# Patient Record
Sex: Male | Born: 1965 | Race: White | Hispanic: No | Marital: Married | Smoking: Former smoker
Health system: Southern US, Community
[De-identification: ages and names within clinical notes are randomized; demographics above are authoritative.]

## PROBLEM LIST (undated history)

## (undated) HISTORY — PX: WRIST SURGERY: SHX841

---

## 2016-06-21 ENCOUNTER — Emergency Department (HOSPITAL_COMMUNITY): Payer: Worker's Compensation

## 2016-06-21 ENCOUNTER — Emergency Department (HOSPITAL_COMMUNITY): Payer: Worker's Compensation | Admitting: Certified Registered Nurse Anesthetist

## 2016-06-21 ENCOUNTER — Encounter (HOSPITAL_COMMUNITY): Admission: EM | Disposition: A | Discharge status: Home Health | Payer: Self-pay | Source: Home / Self Care

## 2016-06-21 ENCOUNTER — Inpatient Hospital Stay (HOSPITAL_COMMUNITY)
Admission: EM | Admit: 2016-06-21 | Discharge: 2016-06-26 | DRG: 473 | Disposition: A | Discharge status: Home Health | Payer: Worker's Compensation | Attending: Emergency Medicine | Admitting: Emergency Medicine

## 2016-06-21 ENCOUNTER — Encounter (HOSPITAL_COMMUNITY): Payer: Self-pay | Admitting: *Deleted

## 2016-06-21 DIAGNOSIS — R42 Dizziness and giddiness: Secondary | ICD-10-CM | POA: Diagnosis not present

## 2016-06-21 DIAGNOSIS — M542 Cervicalgia: Secondary | ICD-10-CM | POA: Diagnosis present

## 2016-06-21 DIAGNOSIS — J029 Acute pharyngitis, unspecified: Secondary | ICD-10-CM | POA: Diagnosis not present

## 2016-06-21 DIAGNOSIS — S0101XA Laceration without foreign body of scalp, initial encounter: Secondary | ICD-10-CM | POA: Diagnosis present

## 2016-06-21 DIAGNOSIS — M2578 Osteophyte, vertebrae: Secondary | ICD-10-CM | POA: Diagnosis present

## 2016-06-21 DIAGNOSIS — S12500A Unspecified displaced fracture of sixth cervical vertebra, initial encounter for closed fracture: Secondary | ICD-10-CM | POA: Diagnosis present

## 2016-06-21 DIAGNOSIS — S129XXA Fracture of neck, unspecified, initial encounter: Secondary | ICD-10-CM

## 2016-06-21 DIAGNOSIS — M50223 Other cervical disc displacement at C6-C7 level: Secondary | ICD-10-CM | POA: Diagnosis present

## 2016-06-21 DIAGNOSIS — Z981 Arthrodesis status: Secondary | ICD-10-CM

## 2016-06-21 DIAGNOSIS — S12600A Unspecified displaced fracture of seventh cervical vertebra, initial encounter for closed fracture: Secondary | ICD-10-CM | POA: Diagnosis present

## 2016-06-21 DIAGNOSIS — Y92410 Unspecified street and highway as the place of occurrence of the external cause: Secondary | ICD-10-CM | POA: Diagnosis not present

## 2016-06-21 DIAGNOSIS — R40241 Glasgow coma scale score 13-15, unspecified time: Secondary | ICD-10-CM | POA: Diagnosis not present

## 2016-06-21 DIAGNOSIS — Z87891 Personal history of nicotine dependence: Secondary | ICD-10-CM

## 2016-06-21 DIAGNOSIS — Z419 Encounter for procedure for purposes other than remedying health state, unspecified: Secondary | ICD-10-CM

## 2016-06-21 HISTORY — PX: ANTERIOR CERVICAL DECOMP/DISCECTOMY FUSION: SHX1161

## 2016-06-21 HISTORY — PX: SCALP LACERATION REPAIR: SHX6089

## 2016-06-21 LAB — I-STAT CHEM 8, ED
BUN: 23 mg/dL — AB (ref 6–20)
CHLORIDE: 104 mmol/L (ref 101–111)
Calcium, Ion: 1.12 mmol/L — ABNORMAL LOW (ref 1.15–1.40)
Creatinine, Ser: 1 mg/dL (ref 0.61–1.24)
GLUCOSE: 120 mg/dL — AB (ref 65–99)
HCT: 43 % (ref 39.0–52.0)
Hemoglobin: 14.6 g/dL (ref 13.0–17.0)
POTASSIUM: 3.8 mmol/L (ref 3.5–5.1)
SODIUM: 139 mmol/L (ref 135–145)
TCO2: 23 mmol/L (ref 0–100)

## 2016-06-21 LAB — COMPREHENSIVE METABOLIC PANEL
ALBUMIN: 3.9 g/dL (ref 3.5–5.0)
ALK PHOS: 43 U/L (ref 38–126)
ALT: 33 U/L (ref 17–63)
ANION GAP: 6 (ref 5–15)
AST: 31 U/L (ref 15–41)
BILIRUBIN TOTAL: 0.7 mg/dL (ref 0.3–1.2)
BUN: 19 mg/dL (ref 6–20)
CO2: 24 mmol/L (ref 22–32)
CREATININE: 1.09 mg/dL (ref 0.61–1.24)
Calcium: 8.7 mg/dL — ABNORMAL LOW (ref 8.9–10.3)
Chloride: 107 mmol/L (ref 101–111)
GFR calc Af Amer: >60 mL/min (ref >60)
GFR calc non Af Amer: >60 mL/min (ref >60)
GLUCOSE: 121 mg/dL — AB (ref 65–99)
Potassium: 3.9 mmol/L (ref 3.5–5.1)
SODIUM: 137 mmol/L (ref 135–145)
TOTAL PROTEIN: 6.2 g/dL — AB (ref 6.5–8.1)

## 2016-06-21 LAB — CBC
HCT: 43.5 % (ref 39.0–52.0)
Hemoglobin: 14.9 g/dL (ref 13.0–17.0)
MCH: 30.6 pg (ref 26.0–34.0)
MCHC: 34.3 g/dL (ref 30.0–36.0)
MCV: 89.3 fL (ref 78.0–100.0)
PLATELETS: 217 10*3/uL (ref 150–400)
RBC: 4.87 MIL/uL (ref 4.22–5.81)
RDW: 12.6 % (ref 11.5–15.5)
WBC: 12 10*3/uL — ABNORMAL HIGH (ref 4.0–10.5)

## 2016-06-21 LAB — I-STAT CG4 LACTIC ACID, ED: Lactic Acid, Venous: 1.55 mmol/L (ref 0.5–1.9)

## 2016-06-21 LAB — MRSA PCR SCREENING: MRSA BY PCR: NEGATIVE

## 2016-06-21 LAB — PROTIME-INR
INR: 0.99
Prothrombin Time: 13.1 seconds (ref 11.4–15.2)

## 2016-06-21 LAB — SAMPLE TO BLOOD BANK

## 2016-06-21 LAB — ETHANOL

## 2016-06-21 SURGERY — ANTERIOR CERVICAL DECOMPRESSION/DISCECTOMY FUSION 1 LEVEL
Anesthesia: General

## 2016-06-21 MED ORDER — HYDROMORPHONE HCL 1 MG/ML IJ SOLN: 0.5000 mg | INTRAMUSCULAR | Status: DC | PRN

## 2016-06-21 MED ORDER — PHENYLEPHRINE HCL 10 MG/ML IJ SOLN
INTRAVENOUS | Status: DC | PRN
  Administered 2016-06-21: 15 ug/min via INTRAVENOUS

## 2016-06-21 MED ORDER — OXYCODONE HCL 5 MG PO TABS: 5.0000 mg | ORAL_TABLET | Freq: Once | ORAL | Status: DC | PRN

## 2016-06-21 MED ORDER — THROMBIN 5000 UNITS EX SOLR
CUTANEOUS | Status: AC
  Filled 2016-06-21: qty 5000

## 2016-06-21 MED ORDER — ONDANSETRON HCL 4 MG/2ML IJ SOLN
INTRAMUSCULAR | Status: AC
  Filled 2016-06-21: qty 2

## 2016-06-21 MED ORDER — MIDAZOLAM HCL 2 MG/2ML IJ SOLN
INTRAMUSCULAR | Status: AC
  Filled 2016-06-21: qty 2

## 2016-06-21 MED ORDER — MIDAZOLAM HCL 2 MG/2ML IJ SOLN
INTRAMUSCULAR | Status: DC | PRN
  Administered 2016-06-21: 2 mg via INTRAVENOUS

## 2016-06-21 MED ORDER — PROPOFOL 10 MG/ML IV BOLUS
INTRAVENOUS | Status: AC
  Filled 2016-06-21: qty 20

## 2016-06-21 MED ORDER — SUGAMMADEX SODIUM 200 MG/2ML IV SOLN
INTRAVENOUS | Status: DC | PRN
  Administered 2016-06-21: 145 mg via INTRAVENOUS

## 2016-06-21 MED ORDER — DEXAMETHASONE SODIUM PHOSPHATE 10 MG/ML IJ SOLN
INTRAMUSCULAR | Status: DC | PRN
  Administered 2016-06-21: 10 mg via INTRAVENOUS

## 2016-06-21 MED ORDER — BACITRACIN 50000 UNITS IM SOLR
INTRAMUSCULAR | Status: DC | PRN
  Administered 2016-06-21: 500 mL

## 2016-06-21 MED ORDER — ROCURONIUM BROMIDE 100 MG/10ML IV SOLN
INTRAVENOUS | Status: DC | PRN
  Administered 2016-06-21: 10 mg via INTRAVENOUS
  Administered 2016-06-21: 50 mg via INTRAVENOUS

## 2016-06-21 MED ORDER — FENTANYL CITRATE (PF) 100 MCG/2ML IJ SOLN: 25.0000 ug | INTRAMUSCULAR | Status: DC | PRN

## 2016-06-21 MED ORDER — CEFAZOLIN SODIUM-DEXTROSE 2-3 GM-% IV SOLR
INTRAVENOUS | Status: DC | PRN
  Administered 2016-06-21: 2 g via INTRAVENOUS

## 2016-06-21 MED ORDER — PROPOFOL 10 MG/ML IV BOLUS
INTRAVENOUS | Status: DC | PRN
  Administered 2016-06-21: 180 mg via INTRAVENOUS

## 2016-06-21 MED ORDER — HEMOSTATIC AGENTS (NO CHARGE) OPTIME
TOPICAL | Status: DC | PRN
  Administered 2016-06-21: 1 via TOPICAL

## 2016-06-21 MED ORDER — THROMBIN 5000 UNITS EX SOLR
CUTANEOUS | Status: DC | PRN
  Administered 2016-06-21 (×4): 5000 [IU] via TOPICAL

## 2016-06-21 MED ORDER — ONDANSETRON HCL 4 MG/2ML IJ SOLN
4.0000 mg | Freq: Four times a day (QID) | INTRAMUSCULAR | Status: DC | PRN
  Administered 2016-06-21: 4 mg via INTRAVENOUS

## 2016-06-21 MED ORDER — HYDROMORPHONE HCL 1 MG/ML IJ SOLN: 1.0000 mg | INTRAMUSCULAR | Status: DC | PRN

## 2016-06-21 MED ORDER — ACETAMINOPHEN 10 MG/ML IV SOLN
INTRAVENOUS | Status: DC | PRN
  Administered 2016-06-21: 1000 mg via INTRAVENOUS

## 2016-06-21 MED ORDER — LIDOCAINE-EPINEPHRINE 1 %-1:100000 IJ SOLN
INTRAMUSCULAR | Status: DC | PRN
Start: 2016-06-21 — End: 2016-06-21
  Administered 2016-06-21: 9 mL

## 2016-06-21 MED ORDER — BUPIVACAINE HCL (PF) 0.5 % IJ SOLN
INTRAMUSCULAR | Status: DC | PRN
  Administered 2016-06-21: 9 mL

## 2016-06-21 MED ORDER — LACTATED RINGERS IV SOLN
INTRAVENOUS | Status: DC
  Administered 2016-06-21 (×3): via INTRAVENOUS

## 2016-06-21 MED ORDER — LIDOCAINE-EPINEPHRINE 1 %-1:100000 IJ SOLN
INTRAMUSCULAR | Status: AC
  Filled 2016-06-21: qty 1

## 2016-06-21 MED ORDER — HYDROMORPHONE HCL 1 MG/ML IJ SOLN
1.0000 mg | INTRAMUSCULAR | Status: DC | PRN
  Administered 2016-06-21: 1 mg via INTRAVENOUS
  Filled 2016-06-21: qty 1

## 2016-06-21 MED ORDER — IOPAMIDOL (ISOVUE-300) INJECTION 61%
INTRAVENOUS | Status: AC
  Administered 2016-06-21: 100 mL
  Filled 2016-06-21: qty 100

## 2016-06-21 MED ORDER — SODIUM CHLORIDE 0.9 % IV SOLN
INTRAVENOUS | Status: DC
  Administered 2016-06-21 – 2016-06-22 (×2): via INTRAVENOUS

## 2016-06-21 MED ORDER — ONDANSETRON 4 MG PO TBDP
4.0000 mg | ORAL_TABLET | Freq: Four times a day (QID) | ORAL | Status: DC | PRN
  Administered 2016-06-22: 4 mg via ORAL
  Filled 2016-06-21: qty 1

## 2016-06-21 MED ORDER — HYDROCODONE-ACETAMINOPHEN 5-325 MG PO TABS
1.0000 | ORAL_TABLET | ORAL | Status: DC | PRN
  Administered 2016-06-21: 2 via ORAL
  Administered 2016-06-22 – 2016-06-24 (×9): 1 via ORAL
  Filled 2016-06-21 (×2): qty 1
  Filled 2016-06-21 (×2): qty 2
  Filled 2016-06-21 (×6): qty 1

## 2016-06-21 MED ORDER — PANTOPRAZOLE SODIUM 40 MG IV SOLR: 40.0000 mg | Freq: Every day | INTRAVENOUS | Status: DC

## 2016-06-21 MED ORDER — SUGAMMADEX SODIUM 200 MG/2ML IV SOLN
INTRAVENOUS | Status: AC
  Filled 2016-06-21: qty 2

## 2016-06-21 MED ORDER — TETANUS-DIPHTH-ACELL PERTUSSIS 5-2.5-18.5 LF-MCG/0.5 IM SUSP
0.5000 mL | Freq: Once | INTRAMUSCULAR | Status: AC
  Administered 2016-06-21: 0.5 mL via INTRAMUSCULAR
  Filled 2016-06-21: qty 0.5

## 2016-06-21 MED ORDER — THROMBIN 5000 UNITS EX SOLR
CUTANEOUS | Status: AC
  Filled 2016-06-21: qty 15000

## 2016-06-21 MED ORDER — SODIUM CHLORIDE 0.9 % IV BOLUS (SEPSIS)
1000.0000 mL | Freq: Once | INTRAVENOUS | Status: AC
  Administered 2016-06-21: 1000 mL via INTRAVENOUS

## 2016-06-21 MED ORDER — FENTANYL CITRATE (PF) 100 MCG/2ML IJ SOLN
INTRAMUSCULAR | Status: DC | PRN
  Administered 2016-06-21 (×5): 50 ug via INTRAVENOUS

## 2016-06-21 MED ORDER — CEFAZOLIN SODIUM-DEXTROSE 1-4 GM/50ML-% IV SOLN
INTRAVENOUS | Status: AC
  Filled 2016-06-21: qty 50

## 2016-06-21 MED ORDER — LACTATED RINGERS IV SOLN
INTRAVENOUS | Status: DC
Start: 2016-06-21 — End: 2016-06-22

## 2016-06-21 MED ORDER — BUPIVACAINE HCL (PF) 0.5 % IJ SOLN
INTRAMUSCULAR | Status: AC
  Filled 2016-06-21: qty 30

## 2016-06-21 MED ORDER — LIDOCAINE 2% (20 MG/ML) 5 ML SYRINGE
INTRAMUSCULAR | Status: DC | PRN
  Administered 2016-06-21: 60 mg via INTRAVENOUS

## 2016-06-21 MED ORDER — PANTOPRAZOLE SODIUM 40 MG PO TBEC
40.0000 mg | DELAYED_RELEASE_TABLET | Freq: Every day | ORAL | Status: DC
  Administered 2016-06-22: 40 mg via ORAL
  Filled 2016-06-21: qty 1

## 2016-06-21 MED ORDER — DEXAMETHASONE SODIUM PHOSPHATE 10 MG/ML IJ SOLN
INTRAMUSCULAR | Status: AC
  Filled 2016-06-21: qty 1

## 2016-06-21 MED ORDER — LIDOCAINE 2% (20 MG/ML) 5 ML SYRINGE
INTRAMUSCULAR | Status: AC
  Filled 2016-06-21: qty 5

## 2016-06-21 MED ORDER — ONDANSETRON HCL 4 MG/2ML IJ SOLN
4.0000 mg | Freq: Once | INTRAMUSCULAR | Status: AC
  Administered 2016-06-21: 4 mg via INTRAVENOUS
  Filled 2016-06-21: qty 2

## 2016-06-21 MED ORDER — ROCURONIUM BROMIDE 10 MG/ML (PF) SYRINGE
PREFILLED_SYRINGE | INTRAVENOUS | Status: AC
  Filled 2016-06-21: qty 5

## 2016-06-21 MED ORDER — ACETAMINOPHEN 10 MG/ML IV SOLN
INTRAVENOUS | Status: AC
  Filled 2016-06-21: qty 100

## 2016-06-21 MED ORDER — OXYCODONE HCL 5 MG/5ML PO SOLN: 5.0000 mg | Freq: Once | ORAL | Status: DC | PRN

## 2016-06-21 MED ORDER — THROMBIN 5000 UNITS EX SOLR
CUTANEOUS | Status: DC | PRN
  Administered 2016-06-21: 17:00:00 via TOPICAL

## 2016-06-21 MED ORDER — BACITRACIN ZINC 500 UNIT/GM EX OINT
TOPICAL_OINTMENT | CUTANEOUS | Status: AC
  Filled 2016-06-21: qty 28.35

## 2016-06-21 MED ORDER — ONDANSETRON HCL 4 MG/2ML IJ SOLN: 4.0000 mg | Freq: Four times a day (QID) | INTRAMUSCULAR | Status: DC | PRN

## 2016-06-21 MED ORDER — SUCCINYLCHOLINE CHLORIDE 200 MG/10ML IV SOSY
PREFILLED_SYRINGE | INTRAVENOUS | Status: DC | PRN
  Administered 2016-06-21: 100 mg via INTRAVENOUS

## 2016-06-21 MED ORDER — FENTANYL CITRATE (PF) 250 MCG/5ML IJ SOLN
INTRAMUSCULAR | Status: AC
  Filled 2016-06-21: qty 5

## 2016-06-21 MED ORDER — 0.9 % SODIUM CHLORIDE (POUR BTL) OPTIME
TOPICAL | Status: DC | PRN
  Administered 2016-06-21: 1000 mL

## 2016-06-21 SURGICAL SUPPLY — 62 items
ALLOGRAFT 7X14X11 (Bone Implant) ×3 IMPLANT
BAG DECANTER FOR FLEXI CONT (MISCELLANEOUS) ×3 IMPLANT
BIT DRILL 14X2.5XNS TI ANT (BIT) ×2 IMPLANT
BIT DRILL AVIATOR 14 (BIT) ×1
BIT DRILL NEURO 2X3.1 SFT TUCH (MISCELLANEOUS) ×2 IMPLANT
BIT DRL 14X2.5XNS TI ANT (BIT) ×2
BLADE CLIPPER SURG (BLADE) ×3 IMPLANT
BLADE ULTRA TIP 2M (BLADE) ×3 IMPLANT
CANISTER SUCT 3000ML PPV (MISCELLANEOUS) ×3 IMPLANT
CARTRIDGE OIL MAESTRO DRILL (MISCELLANEOUS) ×2 IMPLANT
COVER MAYO STAND STRL (DRAPES) ×3 IMPLANT
DECANTER SPIKE VIAL GLASS SM (MISCELLANEOUS) ×3 IMPLANT
DERMABOND ADVANCED (GAUZE/BANDAGES/DRESSINGS) ×1
DERMABOND ADVANCED .7 DNX12 (GAUZE/BANDAGES/DRESSINGS) ×2 IMPLANT
DIFFUSER DRILL AIR PNEUMATIC (MISCELLANEOUS) ×3 IMPLANT
DRAPE HALF SHEET 40X57 (DRAPES) IMPLANT
DRAPE LAPAROTOMY 100X72 PEDS (DRAPES) ×3 IMPLANT
DRAPE MICROSCOPE LEICA (MISCELLANEOUS) ×3 IMPLANT
DRAPE POUCH INSTRU U-SHP 10X18 (DRAPES) ×3 IMPLANT
DRILL NEURO 2X3.1 SOFT TOUCH (MISCELLANEOUS) ×3
ELECT COATED BLADE 2.86 ST (ELECTRODE) ×3 IMPLANT
ELECT REM PT RETURN 9FT ADLT (ELECTROSURGICAL) ×3
ELECTRODE REM PT RTRN 9FT ADLT (ELECTROSURGICAL) ×2 IMPLANT
GAUZE SPONGE 4X4 12PLY STRL (GAUZE/BANDAGES/DRESSINGS) ×6 IMPLANT
GLOVE BIO SURGEON STRL SZ8 (GLOVE) ×3 IMPLANT
GLOVE BIOGEL PI IND STRL 7.5 (GLOVE) ×6 IMPLANT
GLOVE BIOGEL PI IND STRL 8 (GLOVE) ×2 IMPLANT
GLOVE BIOGEL PI INDICATOR 7.5 (GLOVE) ×3
GLOVE BIOGEL PI INDICATOR 8 (GLOVE) ×1
GLOVE ECLIPSE 7.0 STRL STRAW (GLOVE) ×3 IMPLANT
GLOVE ECLIPSE 7.5 STRL STRAW (GLOVE) ×3 IMPLANT
GLOVE EXAM NITRILE LRG STRL (GLOVE) IMPLANT
GLOVE EXAM NITRILE XL STR (GLOVE) IMPLANT
GLOVE EXAM NITRILE XS STR PU (GLOVE) IMPLANT
GLOVE SURG SS PI 7.0 STRL IVOR (GLOVE) ×9 IMPLANT
GOWN STRL REUS W/ TWL LRG LVL3 (GOWN DISPOSABLE) IMPLANT
GOWN STRL REUS W/ TWL XL LVL3 (GOWN DISPOSABLE) ×8 IMPLANT
GOWN STRL REUS W/TWL 2XL LVL3 (GOWN DISPOSABLE) IMPLANT
GOWN STRL REUS W/TWL LRG LVL3 (GOWN DISPOSABLE)
GOWN STRL REUS W/TWL XL LVL3 (GOWN DISPOSABLE) ×4
HALTER HD/CHIN CERV TRACTION D (MISCELLANEOUS) ×3 IMPLANT
HEMOSTAT POWDER KIT SURGIFOAM (HEMOSTASIS) ×6 IMPLANT
KIT BASIN OR (CUSTOM PROCEDURE TRAY) ×3 IMPLANT
KIT ROOM TURNOVER OR (KITS) ×3 IMPLANT
NEEDLE HYPO 25X1 1.5 SAFETY (NEEDLE) ×3 IMPLANT
NEEDLE SPNL 22GX3.5 QUINCKE BK (NEEDLE) ×6 IMPLANT
NS IRRIG 1000ML POUR BTL (IV SOLUTION) ×6 IMPLANT
OIL CARTRIDGE MAESTRO DRILL (MISCELLANEOUS) ×3
PACK LAMINECTOMY NEURO (CUSTOM PROCEDURE TRAY) ×3 IMPLANT
PAD ARMBOARD 7.5X6 YLW CONV (MISCELLANEOUS) ×9 IMPLANT
PLATE AVIATOR ASSY 1LVL SZ 14 (Plate) ×3 IMPLANT
RUBBERBAND STERILE (MISCELLANEOUS) ×6 IMPLANT
SCREW SELF TAP AVIATOR 4X14 (Screw) ×12 IMPLANT
SPONGE INTESTINAL PEANUT (DISPOSABLE) ×3 IMPLANT
SPONGE SURGIFOAM ABS GEL SZ50 (HEMOSTASIS) ×3 IMPLANT
STAPLER PROXIMATE FIRING4 30MM (STAPLE) ×3 IMPLANT
STAPLER SKIN PROX WIDE 3.9 (STAPLE) IMPLANT
SUT VIC AB 2-0 CP2 18 (SUTURE) ×3 IMPLANT
SUT VIC AB 3-0 SH 8-18 (SUTURE) ×6 IMPLANT
TOWEL GREEN STERILE (TOWEL DISPOSABLE) ×3 IMPLANT
TOWEL GREEN STERILE FF (TOWEL DISPOSABLE) IMPLANT
WATER STERILE IRR 1000ML POUR (IV SOLUTION) ×3 IMPLANT

## 2016-06-21 NOTE — ED Notes (Signed)
Pt reports numbness and weakness to both upper extremities.

## 2016-06-21 NOTE — Transfer of Care (Signed)
Immediate Anesthesia Transfer of Care Note  Patient: James Whitney  Procedure(s) Performed: Procedure(s) with comments: ANTERIOR CERVICAL DECOMPRESSION/DISCECTOMY FUSION  Cervical Six-Seven 6 (N/A) - ANTERIOR CERVICAL DECOMPRESSION/DISCECTOMY FUSION  Cervical Six-Seven 6 SCALP LACERATION REPAIR - Frontal Parietal Curvy Linear SCALP LACERATION REPAIR  Patient Location: PACU  Anesthesia Type:General  Level of Consciousness: awake, alert  and oriented  Airway & Oxygen Therapy: Patient Spontanous Breathing and Patient connected to nasal cannula oxygen  Post-op Assessment: Report given to RN, Post -op Vital signs reviewed and stable and Patient moving all extremities continues to be weak in UE bilaterally  Post vital signs: Reviewed and stable  Last Vitals:  Vitals:   06/21/16 1300 06/21/16 1739  BP: (!) 151/86   Pulse:    Resp: 18 (P) 15  Temp:  (P) 36.7 C    Last Pain:  Vitals:   06/21/16 1739  PainSc: (P) Asleep         Complications: No apparent anesthesia complications

## 2016-06-21 NOTE — Anesthesia Procedure Notes (Signed)
Procedure Name: Intubation Date/Time: 06/21/2016 3:18 PM Performed by: Orvilla FusATO, Isiaih Hollenbach A Pre-anesthesia Checklist: Patient identified, Emergency Drugs available, Suction available and Patient being monitored Patient Re-evaluated:Patient Re-evaluated prior to inductionOxygen Delivery Method: Circle System Utilized Preoxygenation: Pre-oxygenation with 100% oxygen Intubation Type: IV induction and Rapid sequence Laryngoscope Size: Glidescope and 4 Grade View: Grade I Tube type: Oral Tube size: 7.5 mm Number of attempts: 1 Airway Equipment and Method: Stylet and Oral airway Placement Confirmation: ETT inserted through vocal cords under direct vision,  positive ETCO2 and breath sounds checked- equal and bilateral Secured at: 23 cm Tube secured with: Tape Dental Injury: Teeth and Oropharynx as per pre-operative assessment

## 2016-06-21 NOTE — ED Notes (Signed)
Called CT.  They will come to pick up pt soon.

## 2016-06-21 NOTE — Anesthesia Preprocedure Evaluation (Addendum)
Anesthesia Evaluation  Patient identified by MRN, date of birth, ID band Patient awake    Reviewed: Allergy & Precautions, H&P , NPO status , Patient's Chart, lab work & pertinent test results  Airway Mallampati: III   Neck ROM: limited  Mouth opening: Limited Mouth Opening Comment: C-collar in place Dental  (+) Teeth Intact, Dental Advisory Given   Pulmonary former smoker,    breath sounds clear to auscultation       Cardiovascular negative cardio ROS   Rhythm:regular Rate:Normal     Neuro/Psych    GI/Hepatic   Endo/Other    Renal/GU      Musculoskeletal C6 fracture   Abdominal   Peds  Hematology   Anesthesia Other Findings   Reproductive/Obstetrics                            Anesthesia Physical Anesthesia Plan  ASA: I  Anesthesia Plan: General   Post-op Pain Management:    Induction: Intravenous  PONV Risk Score and Plan: 2 and Ondansetron, Dexamethasone and Treatment may vary due to age or medical condition  Airway Management Planned: Oral ETT and Video Laryngoscope Planned  Additional Equipment:   Intra-op Plan:   Post-operative Plan: Extubation in OR  Informed Consent: I have reviewed the patients History and Physical, chart, labs and discussed the procedure including the risks, benefits and alternatives for the proposed anesthesia with the patient or authorized representative who has indicated his/her understanding and acceptance.     Plan Discussed with: CRNA, Anesthesiologist and Surgeon  Anesthesia Plan Comments:         Anesthesia Quick Evaluation
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
## 2016-06-21 NOTE — Anesthesia Postprocedure Evaluation (Signed)
Anesthesia Post Note  Patient: James Whitney  Procedure(s) Performed: Procedure(s) (LRB): ANTERIOR CERVICAL DECOMPRESSION/DISCECTOMY FUSION  Cervical Six-Seven 6 (N/A) SCALP LACERATION REPAIR     Patient location during evaluation: PACU Anesthesia Type: General Level of consciousness: awake Pain management: pain level controlled Vital Signs Assessment: post-procedure vital signs reviewed and stable Respiratory status: spontaneous breathing Cardiovascular status: stable Anesthetic complications: no    Last Vitals:  Vitals:   06/21/16 1300 06/21/16 1739  BP: (!) 151/86 114/89  Pulse:  71  Resp: 18 15  Temp:  36.7 C    Last Pain:  Vitals:   06/21/16 1739  PainSc: Asleep                 Alizea Pell

## 2016-06-21 NOTE — ED Notes (Signed)
Pt undressed and belongings given to family.  Pt taken to MRI by this RN Buzzy Hanharles Carim and Buel ReamSara Slack RN for stabilized transfer to MRI table. Pt will go to OR from MRI by this RN
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
## 2016-06-21 NOTE — ED Notes (Signed)
Patient transported to CT 

## 2016-06-21 NOTE — ED Notes (Signed)
Neuro surgery at bedside.

## 2016-06-21 NOTE — ED Notes (Signed)
ED Provider at bedside. 

## 2016-06-21 NOTE — Progress Notes (Signed)
Vitals:   06/21/16 1200 06/21/16 1230 06/21/16 1300 06/21/16 1739  BP: (!) 144/79 (!) 148/84 (!) 151/86 114/89  Pulse: 61 73  71  Resp: 14 (!) 22 18 15   Temp:    98 F (36.7 C)  SpO2: 98% 98%  97%  Weight:      Height:        CBC  Recent Labs  06/21/16 1013 06/21/16 1050  WBC 12.0*  --   HGB 14.9 14.6  HCT 43.5 43.0  PLT 217  --    BMET  Recent Labs  06/21/16 1013 06/21/16 1050  NA 137 139  K 3.9 3.8  CL 107 104  CO2 24  --   GLUCOSE 121* 120*  BUN 19 23*  CREATININE 1.09 1.00  CALCIUM 8.7*  --     Patient resting comfortably in PACU. Incision clean and dry. Moving all 4 extremities. 5/5 strength in deltoid and biceps.  Continued significant weakness in the triceps, intrinsics, and grips bilaterally, unchanged as compared to prior to surgery. Strength 5/5 in lower extremities. Immobilized in an [employer] cervical collar.  Plan: Table following surgery. For transfer to 4 N. neurosurgery/trauma ICU. Spoke with Dr. Lindie SpruceWyatt that patient will be able to be progressively mobilize tomorrow, and that we will need a upright lateral cervical spine x-ray once standing.  Hewitt ShortsNUDELMAN,ROBERT W, MD 06/21/2016, 5:53 PM

## 2016-06-21 NOTE — ED Notes (Signed)
PT in MRI will go to OR by this RN when MRI is complete

## 2016-06-21 NOTE — Op Note (Signed)
OPERATIVE REPORT  DATE OF OPERATION: 06/21/2016  PATIENT:  James Whitney  51 y.o. male  PRE-OPERATIVE DIAGNOSIS:  Mid Frontoparietal scalp laceration. 7 cm long  POST-OPERATIVE DIAGNOSIS:  Same  INDICATION(S) FOR OPERATION:  Open scalp laceration  FINDINGS:  Deep laceration of the scalp without boney involvement  PROCEDURE:  Procedure(s): ANTERIOR CERVICAL DECOMPRESSION/DISCECTOMY FUSION  Cervical Six-Seven 6 SCALP LACERATION REPAIR  SURGEON:  Jimmye NormanWyatt, Graceson Nichelson, MD  ASSISTANT: None  ANESTHESIA:   general  COMPLICATIONS:  None  EBL: <5 ml  BLOOD ADMINISTERED: none  DRAINS: none   SPECIMEN:  No Specimen  COUNTS CORRECT:  YES  PROCEDURE DETAILS: The patient remained in the operating room after an ACDF of a an acute fracture of the cervical spine at the C6-7 level. He had a mid frontoparietal scalp laceration.  This area was prepped with towels. We irrigated with saline solution. We subsequently repaired the laceration which was 7 cm long with stainless steel staples. A sterile dressing was applied. All counts were correct.  PATIENT DISPOSITION:  PACU - hemodynamically stable.   [name] 6/20/20185:41 PM

## 2016-06-22 ENCOUNTER — Encounter (HOSPITAL_COMMUNITY): Payer: Self-pay | Admitting: Neurosurgery

## 2016-06-22 LAB — COMPREHENSIVE METABOLIC PANEL
ALT: 25 U/L (ref 17–63)
ANION GAP: 7 (ref 5–15)
AST: 25 U/L (ref 15–41)
Albumin: 3.4 g/dL — ABNORMAL LOW (ref 3.5–5.0)
Alkaline Phosphatase: 38 U/L (ref 38–126)
BUN: 12 mg/dL (ref 6–20)
CHLORIDE: 106 mmol/L (ref 101–111)
CO2: 23 mmol/L (ref 22–32)
CREATININE: 0.9 mg/dL (ref 0.61–1.24)
Calcium: 8.3 mg/dL — ABNORMAL LOW (ref 8.9–10.3)
Glucose, Bld: 138 mg/dL — ABNORMAL HIGH (ref 65–99)
Potassium: 3.9 mmol/L (ref 3.5–5.1)
Sodium: 136 mmol/L (ref 135–145)
Total Bilirubin: 0.9 mg/dL (ref 0.3–1.2)
Total Protein: 5.6 g/dL — ABNORMAL LOW (ref 6.5–8.1)

## 2016-06-22 LAB — CBC
HCT: 37.6 % — ABNORMAL LOW (ref 39.0–52.0)
Hemoglobin: 12.6 g/dL — ABNORMAL LOW (ref 13.0–17.0)
MCH: 30.2 pg (ref 26.0–34.0)
MCHC: 33.5 g/dL (ref 30.0–36.0)
MCV: 90.2 fL (ref 78.0–100.0)
PLATELETS: 205 10*3/uL (ref 150–400)
RBC: 4.17 MIL/uL — AB (ref 4.22–5.81)
RDW: 12.7 % (ref 11.5–15.5)
WBC: 8.8 10*3/uL (ref 4.0–10.5)

## 2016-06-22 LAB — HIV ANTIBODY (ROUTINE TESTING W REFLEX): HIV Screen 4th Generation wRfx: NONREACTIVE

## 2016-06-22 MED ORDER — PHENOL 1.4 % MT LIQD
1.0000 | OROMUCOSAL | Status: DC | PRN
  Filled 2016-06-22: qty 177
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
## 2016-06-22 NOTE — Progress Notes (Signed)
Received pt. From 4N this shift ~1700. Pt. Has not ambulated today, ambulated down the hallway with 1 person assist up to the nurses station. Placed in the chair when pt. Suddenly became dizzy, and nauseated. Moved back to bed. Dr. Newell CoralNudelman on the unit and made aware.   Bladder Scanned by NT, showing 391 cc, pt. Voided 400cc, re-scanned by NT with 0 cc showing.

## 2016-06-23 ENCOUNTER — Inpatient Hospital Stay (HOSPITAL_COMMUNITY): Payer: Worker's Compensation

## 2016-06-23 LAB — CBC
HCT: 38.8 % — ABNORMAL LOW (ref 39.0–52.0)
Hemoglobin: 13 g/dL (ref 13.0–17.0)
MCH: 30.6 pg (ref 26.0–34.0)
MCHC: 33.5 g/dL (ref 30.0–36.0)
MCV: 91.3 fL (ref 78.0–100.0)
Platelets: 220 10*3/uL (ref 150–400)
RBC: 4.25 MIL/uL (ref 4.22–5.81)
RDW: 12.8 % (ref 11.5–15.5)
WBC: 9.1 10*3/uL (ref 4.0–10.5)

## 2016-06-23 MED ORDER — IBUPROFEN 200 MG PO TABS
400.0000 mg | ORAL_TABLET | Freq: Four times a day (QID) | ORAL | Status: DC
  Administered 2016-06-23 – 2016-06-26 (×13): 400 mg via ORAL
  Filled 2016-06-23 (×13): qty 2

## 2016-06-23 MED ORDER — PANTOPRAZOLE SODIUM 40 MG PO TBEC
40.0000 mg | DELAYED_RELEASE_TABLET | Freq: Every day | ORAL | Status: DC
  Administered 2016-06-23 – 2016-06-26 (×4): 40 mg via ORAL
  Filled 2016-06-23 (×4): qty 1

## 2016-06-23 MED ORDER — ENOXAPARIN SODIUM 40 MG/0.4ML ~~LOC~~ SOLN
40.0000 mg | Freq: Every day | SUBCUTANEOUS | Status: DC
  Administered 2016-06-23: 40 mg via SUBCUTANEOUS
  Filled 2016-06-23 (×4): qty 0.4

## 2016-06-23 MED ORDER — METHOCARBAMOL 750 MG PO TABS
750.0000 mg | ORAL_TABLET | Freq: Three times a day (TID) | ORAL | Status: DC | PRN
  Administered 2016-06-23 – 2016-06-25 (×3): 750 mg via ORAL
  Filled 2016-06-23 (×5): qty 1
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
## 2016-06-23 NOTE — Progress Notes (Signed)
Subjective: Patient resting in bed, comfortable. Immobilized in [place] cervical collar. Has not ambulated since the one time yesterday evening. He did stand up on the side of the bed this morning and voided. Doing passive range of motion for digits of hands with family. Awaiting PT and OT consultation and therapy.  Objective: Vital signs in last 24 hours: Vitals:   06/22/16 1716 06/22/16 2055 06/23/16 0123 06/23/16 0554  BP: 130/63 (!) 128/57 129/67 137/69  Pulse: 63 66 70 63  Resp: 20 20 18 18   Temp: 99.8 F (37.7 C) 98.9 F (37.2 C) 100 F (37.8 C) 99 F (37.2 C)  TempSrc: Oral Oral Oral Oral  SpO2: 95% 96% 94% 95%  Weight:      Height:        Intake/Output from previous day: 06/21 0701 - 06/22 0700 In: 1259.6 [P.O.:120; I.V.:1139.6] Out: 3450 [Urine:3450] Intake/Output this shift: No intake/output data recorded.  Physical Exam:  Awake alert, fully oriented. Following commands. Speech fluent. Left intrinsics 1-2, right intrinsics 2.  CBC  Recent Labs  06/21/16 1013 06/21/16 1050 06/22/16 0820  WBC 12.0*  --  8.8  HGB 14.9 14.6 12.6*  HCT 43.5 43.0 37.6*  PLT 217  --  205   BMET  Recent Labs  06/21/16 1013 06/21/16 1050 06/22/16 0534  NA 137 139 136  K 3.9 3.8 3.9  CL 107 104 106  CO2 24  --  23  GLUCOSE 121* 120* 138*  BUN 19 23* 12  CREATININE 1.09 1.00 0.90  CALCIUM 8.7*  --  8.3*    Assessment/Plan: Stable status post C6-7 ACDF for ORIF for C6-7 fracture dislocation. Some improved movement of left intrinsics this morning. Has spoken with nursing staff regarding progressing mobility and ambulation. They will monitor patient while doing so, since he became vasovagal yesterday afternoon after his first ambulation. For upright AP and lateral cervical spine x-rays. Awaiting PT and OT.   Hewitt ShortsNUDELMAN,ROBERT W, MD 06/23/2016, 8:02 AM
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
## 2016-06-24 LAB — URINALYSIS, ROUTINE W REFLEX MICROSCOPIC
BILIRUBIN URINE: NEGATIVE
Glucose, UA: NEGATIVE mg/dL
Hgb urine dipstick: NEGATIVE
Ketones, ur: 5 mg/dL — AB
Leukocytes, UA: NEGATIVE
NITRITE: NEGATIVE
PH: 6 (ref 5.0–8.0)
Protein, ur: NEGATIVE mg/dL
SPECIFIC GRAVITY, URINE: 1.015 (ref 1.005–1.030)

## 2016-06-24 MED ORDER — IBUPROFEN 400 MG PO TABS: 400.0000 mg | ORAL_TABLET | Freq: Four times a day (QID) | ORAL | 0 refills | Status: AC

## 2016-06-24 MED ORDER — METHOCARBAMOL 750 MG PO TABS: 750.0000 mg | ORAL_TABLET | Freq: Three times a day (TID) | ORAL | 1 refills | Status: AC | PRN

## 2016-06-24 MED ORDER — HYDROCODONE-ACETAMINOPHEN 5-325 MG PO TABS: 1.0000 | ORAL_TABLET | ORAL | 0 refills | Status: DC | PRN
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
## 2016-06-24 NOTE — Progress Notes (Signed)
Patient lacerations on head assessed, old drainage, open to air with no signs of pain, redness or swelling. Patient surgical incision clean, dry, intact with skin glue. Minor redness and swelling on left side of incision. Hard ASPEN collar applied and appropriatly in place. Patient used front wheeled walker and ambulated in hall. Patient tolerated ambulation well. Patients pain 6/10 alleviated by scheduled Advil and 5-325 mg tab Vicodin. Patient sleeping comfortably. RN will continue to monitor.
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
## 2016-06-25 NOTE — Progress Notes (Signed)
Patient's bilateral hands noted much better strength the end of RN shift. Patient has been ambulates around the nursing unit today without any assistance. Pt denied pain but ok with advil as prescribed. Will continue to monitor.  Sim BoastHavy, RN
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
## 2016-06-25 NOTE — Progress Notes (Signed)
Patient ID: James Whitney, male   DOB: 05/17/1965, 51 y.o.   MRN: [id number] Patient doing much better significant improvement in pain still weakness in his hand intrinsics and triceps.  Strength 4 minus out of 5 left tricep 4-5 right tricep and intrinsics also 4 minus out of 5 left greater than right  X-ray shows excellent alignment and continue c-collar patient will be observed in the hospital tomorrow possible discharge tomorrow Dr. Newell CoralNudelman to see in the morning.
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
## 2016-06-26 MED ORDER — MENTHOL 3 MG MT LOZG
1.0000 | LOZENGE | OROMUCOSAL | Status: DC | PRN
  Filled 2016-06-26: qty 9

## 2016-06-26 NOTE — Progress Notes (Signed)
Discharge instructions given. Pt verbalized understanding and all questions were answered.  
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
